# Patient Record
Sex: Female | Born: 2000 | Hispanic: No | Marital: Single | Smoking: Never smoker
Health system: Southern US, Community
[De-identification: ages and names within clinical notes are randomized; demographics above are authoritative.]

## PROBLEM LIST (undated history)

## (undated) DIAGNOSIS — D563 Thalassemia minor: Secondary | ICD-10-CM

## (undated) DIAGNOSIS — E282 Polycystic ovarian syndrome: Secondary | ICD-10-CM

## (undated) DIAGNOSIS — D649 Anemia, unspecified: Secondary | ICD-10-CM

## (undated) DIAGNOSIS — E559 Vitamin D deficiency, unspecified: Secondary | ICD-10-CM

## (undated) HISTORY — DX: Vitamin D deficiency, unspecified: E55.9

## (undated) HISTORY — DX: Thalassemia minor: D56.3

## (undated) HISTORY — DX: Anemia, unspecified: D64.9

## (undated) HISTORY — DX: Polycystic ovarian syndrome: E28.2

---
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
## 2023-05-30 ENCOUNTER — Encounter: Payer: Self-pay | Admitting: Family Medicine

## 2023-05-30 ENCOUNTER — Ambulatory Visit: Payer: BC Managed Care – PPO | Admitting: Family Medicine

## 2023-05-30 ENCOUNTER — Ambulatory Visit (INDEPENDENT_AMBULATORY_CARE_PROVIDER_SITE_OTHER): Payer: BC Managed Care – PPO | Admitting: Family Medicine

## 2023-05-30 VITALS — BP 98/60 | HR 80 | Temp 98.6°F | Ht 69.5 in | Wt 151.2 lb

## 2023-05-30 DIAGNOSIS — R5383 Other fatigue: Secondary | ICD-10-CM

## 2023-05-30 DIAGNOSIS — L659 Nonscarring hair loss, unspecified: Secondary | ICD-10-CM | POA: Diagnosis not present

## 2023-05-30 DIAGNOSIS — E559 Vitamin D deficiency, unspecified: Secondary | ICD-10-CM | POA: Diagnosis not present

## 2023-05-30 DIAGNOSIS — D563 Thalassemia minor: Secondary | ICD-10-CM | POA: Diagnosis not present

## 2023-05-30 DIAGNOSIS — R252 Cramp and spasm: Secondary | ICD-10-CM

## 2023-05-30 NOTE — Assessment & Plan Note (Signed)
Diffuse, likely secondary to weight loss and stress from exams. Checking TSH, D and B12, she already has appt with GYN to rule out PCOS.Marland KitchenMarland Kitchen

## 2023-05-30 NOTE — Progress Notes (Signed)
New Patient Office Visit  Subjective    Patient ID: Tammy Potter, female    DOB: 2001/01/11  Age: 22 y.o. MRN: [id number]  CC:  Chief Complaint  Patient presents with   Establish Care    HPI Tammy Potter presents to establish care Patient is reporting an increase in fatigue and hair loss. Was diagnosed with PCOS some time ago, taking supplements currently. States that the hair loss maybe got a little better, but then had her exams and it got worse, states that she lost weight, about 5 kg also too. Was not placed on any medications for the PCOS, states she will be getting a second opinion, states that her presentation was unusual, was having regular periods, but also having ovulation bleeds, already has an appt with GYN on the 22nd.   States that she used to be overweight, used to weigh 90Kg, states that she intentionally lost weight and reduced it down to 68.6  Also having muscle cramps with the fatigue, also eye twitches but this seems to be better.   Reports family history of goiter in her father, states she did have labs done but they looked normal.   Current Outpatient Medications  Medication Instructions   BIOTIN PO 10 mg, Oral, Daily   OVER THE COUNTER MEDICATION Calcium citrate 400mg   +vit D3  12.5mg -daily   OVER THE COUNTER MEDICATION Grapexon-green tea/grape seed extract/zinc-2 capsules daily   VITAMIN D PO 50,000 Units, Oral, Every 30 days    Past Medical History:  Diagnosis Date   Anemia    PCOS (polycystic ovarian syndrome)    Thalassemia minor    Vitamin D deficiency     History reviewed. No pertinent surgical history.  Family History  Problem Relation Age of Onset   Hyperlipidemia Mother    Hypertension Mother    Heart disease Mother    Hypertension Father    Hyperlipidemia Father    Hearing loss Father    Miscarriages / Stillbirths Maternal Grandmother    Hyperlipidemia Maternal Grandmother    Heart disease Maternal Grandmother    Hearing loss  Maternal Grandmother    Diabetes Maternal Grandmother    COPD Maternal Grandmother    Heart attack Maternal Grandmother    Hyperlipidemia Maternal Grandfather    Heart disease Maternal Grandfather    Hearing loss Maternal Grandfather    Diabetes Maternal Grandfather    Arthritis Maternal Grandfather    CVA Maternal Grandfather    Lupus Paternal Grandmother    Arthritis Paternal Grandfather     Social History   Socioeconomic History   Marital status: Single    Spouse name: Not on file   Number of children: Not on file   Years of education: Not on file   Highest education level: Not on file  Occupational History   Not on file  Tobacco Use   Smoking status: Never   Smokeless tobacco: Never  Vaping Use   Vaping status: Never Used  Substance and Sexual Activity   Alcohol use: Never   Drug use: Never   Sexual activity: Never  Other Topics Concern   Not on file  Social History Narrative   Not on file   Social Determinants of Health   Financial Resource Strain: Not on file  Food Insecurity: Not on file  Transportation Needs: Not on file  Physical Activity: Not on file  Stress: Not on file  Social Connections: Not on file  Intimate Partner Violence: Not on file  Review of Systems  Constitutional:  Positive for malaise/fatigue. Negative for chills and fever.  Musculoskeletal:  Positive for myalgias.  Neurological:  Negative for dizziness and headaches.  Endo/Heme/Allergies: Negative.        Cold intolerance  All other systems reviewed and are negative.       Objective    BP 98/60 (BP Location: Left Arm, Patient Position: Sitting, Cuff Size: Normal)   Pulse 80   Temp 98.6 F (37 C) (Oral)   Ht 5' 9.5" (1.765 m)   Wt 151 lb 3.2 oz (68.6 kg)   LMP 05/19/2023 (Exact Date)   SpO2 99%   BMI 22.01 kg/m   Physical Exam Vitals reviewed.  Constitutional:      Appearance: Normal appearance. She is well-groomed and normal weight.  Eyes:      Conjunctiva/sclera: Conjunctivae normal.  Neck:     Thyroid: No thyromegaly.  Cardiovascular:     Rate and Rhythm: Normal rate and regular rhythm.     Pulses: Normal pulses.     Heart sounds: S1 normal and S2 normal.  Pulmonary:     Effort: Pulmonary effort is normal.     Breath sounds: Normal breath sounds and air entry.  Abdominal:     General: Bowel sounds are normal.  Musculoskeletal:     Right lower leg: No edema.     Left lower leg: No edema.  Neurological:     Mental Status: She is alert and oriented to person, place, and time. Mental status is at baseline.     Gait: Gait is intact.  Psychiatric:        Mood and Affect: Mood and affect normal.        Speech: Speech normal.        Behavior: Behavior normal.        Judgment: Judgment normal.         Assessment & Plan:  Thalassemia minor Assessment & Plan: Checking iron panel today to follow up on this, pt was taking PO iron but hasn't been on it in some time. 11 is usually her baseline hemoglobin.   Hair loss Assessment & Plan: Diffuse, likely secondary to weight loss and stress from exams. Checking TSH, D and B12, she already has appt with GYN to rule out PCOS...  Orders: -     TSH -     Iron, TIBC and Ferritin Panel  Muscle cramps -     VITAMIN D 25 Hydroxy (Vit-D Deficiency, Fractures) -     Comprehensive metabolic panel -     Magnesium  Other fatigue -     Vitamin B12 -     Iron, TIBC and Ferritin Panel    Return in about 1 year (around 05/29/2024).   Karie Georges, MD

## 2023-05-30 NOTE — Assessment & Plan Note (Signed)
Checking iron panel today to follow up on this, pt was taking PO iron but hasn't been on it in some time. 11 is usually her baseline hemoglobin.

## 2023-05-31 LAB — COMPREHENSIVE METABOLIC PANEL
AG Ratio: 2 (calc) (ref 1.0–2.5)
ALT: 13 U/L (ref 6–29)
AST: 15 U/L (ref 10–30)
Albumin: 4.9 g/dL (ref 3.6–5.1)
Alkaline phosphatase (APISO): 31 U/L (ref 31–125)
BUN: 15 mg/dL (ref 7–25)
CO2: 25 mmol/L (ref 20–32)
Calcium: 10.7 mg/dL — ABNORMAL HIGH (ref 8.6–10.2)
Chloride: 104 mmol/L (ref 98–110)
Creat: 0.77 mg/dL (ref 0.50–0.96)
Globulin: 2.5 g/dL (calc) (ref 1.9–3.7)
Glucose, Bld: 78 mg/dL (ref 65–99)
Potassium: 4.3 mmol/L (ref 3.5–5.3)
Sodium: 140 mmol/L (ref 135–146)
Total Bilirubin: 0.7 mg/dL (ref 0.2–1.2)
Total Protein: 7.4 g/dL (ref 6.1–8.1)

## 2023-05-31 LAB — VITAMIN D 25 HYDROXY (VIT D DEFICIENCY, FRACTURES): Vit D, 25-Hydroxy: 27 ng/mL — ABNORMAL LOW (ref 30–100)

## 2023-05-31 LAB — IRON,TIBC AND FERRITIN PANEL
%SAT: 17 % (calc) (ref 16–45)
Ferritin: 12 ng/mL — ABNORMAL LOW (ref 16–154)
Iron: 85 ug/dL (ref 40–190)
TIBC: 503 mcg/dL (calc) — ABNORMAL HIGH (ref 250–450)

## 2023-05-31 LAB — MAGNESIUM: Magnesium: 2.2 mg/dL (ref 1.5–2.5)

## 2023-05-31 LAB — VITAMIN B12: Vitamin B-12: 410 pg/mL (ref 200–1100)

## 2023-05-31 LAB — TSH: TSH: 1.52 mIU/L

## 2023-06-02 DIAGNOSIS — N9089 Other specified noninflammatory disorders of vulva and perineum: Secondary | ICD-10-CM | POA: Diagnosis not present

## 2023-06-02 DIAGNOSIS — E282 Polycystic ovarian syndrome: Secondary | ICD-10-CM | POA: Diagnosis not present

## 2023-06-04 ENCOUNTER — Encounter: Payer: Self-pay | Admitting: Family Medicine

## 2023-06-10 ENCOUNTER — Telehealth: Payer: Self-pay | Admitting: Family Medicine

## 2023-06-10 DIAGNOSIS — G514 Facial myokymia: Secondary | ICD-10-CM | POA: Diagnosis not present

## 2023-06-10 DIAGNOSIS — H53143 Visual discomfort, bilateral: Secondary | ICD-10-CM | POA: Diagnosis not present

## 2023-06-10 DIAGNOSIS — H52533 Spasm of accommodation, bilateral: Secondary | ICD-10-CM | POA: Diagnosis not present

## 2023-06-10 NOTE — Telephone Encounter (Signed)
Katie with [facility] is requesting the most recent  Office visit notes   Please Fax To: [phone]

## 2023-06-10 NOTE — Telephone Encounter (Signed)
Spoke with Alcario Drought at [facility] [phone]) and asked to have her inform Florentina Addison our office cannot send records without a signed release from the patient.

## 2023-07-03 ENCOUNTER — Ambulatory Visit: Payer: Self-pay | Admitting: Family Medicine

## 2024-05-06 ENCOUNTER — Encounter: Payer: Self-pay | Admitting: Family Medicine

## 2024-05-06 ENCOUNTER — Ambulatory Visit (INDEPENDENT_AMBULATORY_CARE_PROVIDER_SITE_OTHER): Admitting: Family Medicine

## 2024-05-06 VITALS — BP 120/88 | HR 84 | Temp 97.9°F | Ht 69.0 in | Wt 151.9 lb

## 2024-05-06 DIAGNOSIS — Z0000 Encounter for general adult medical examination without abnormal findings: Secondary | ICD-10-CM

## 2024-05-06 DIAGNOSIS — D563 Thalassemia minor: Secondary | ICD-10-CM

## 2024-05-06 DIAGNOSIS — E611 Iron deficiency: Secondary | ICD-10-CM | POA: Diagnosis not present

## 2024-05-06 DIAGNOSIS — E559 Vitamin D deficiency, unspecified: Secondary | ICD-10-CM

## 2024-05-06 NOTE — Progress Notes (Signed)
 Complete physical exam  Patient: Tammy Potter   DOB: 2001-03-02   22 y.o. Female  MRN: [id number]  Subjective:    Chief Complaint  Patient presents with   Annual Exam    Tammy Potter is a 23 y.o. female who presents today for a complete physical exam. She reports consuming a general diet. Home exercise routine includes walking 1-2 hrs per week. Gym/ health club routine includes cardio and light weights three times per week. She generally feels well. She reports sleeping fairly well. She does not have additional problems to discuss today.   Pt was taking iron and vitamin D  but hasn't been on them for some time, occasionally takes melatonin at night.  Most recent fall risk assessment:     No data to display           Most recent depression screenings:    05/30/2023   10:30 AM  PHQ 2/9 Scores  PHQ - 2 Score 0  PHQ- 9 Score 2    Vision:Within last year and Dental: No current dental problems and Receives regular dental care  Patient Active Problem List   Diagnosis Date Noted   Hair loss 05/30/2023   Other fatigue 05/30/2023   Thalassemia minor       Patient Care Team: Ozell Heron HERO, MD as PCP - General (Family Medicine)   Outpatient Medications Prior to Visit  Medication Sig   MELATONIN PO Take by mouth daily.   BIOTIN PO Take 10 mg by mouth daily.   OVER THE COUNTER MEDICATION Calcium citrate 400mg   +vit D3  12.5mg -daily   OVER THE COUNTER MEDICATION Grapexon-green tea/grape seed extract/zinc-2 capsules daily   VITAMIN D  PO Take 50,000 Units by mouth every 30 (thirty) days.   No facility-administered medications prior to visit.    Review of Systems  HENT:  Negative for hearing loss.   Eyes:  Negative for blurred vision.  Respiratory:  Negative for shortness of breath.   Cardiovascular:  Negative for chest pain.  Gastrointestinal: Negative.   Genitourinary: Negative.   Musculoskeletal:  Negative for back pain.  Neurological:  Negative for headaches.   Psychiatric/Behavioral:  Negative for depression.        Objective:     BP 120/88   Pulse 84   Temp 97.9 F (36.6 C) (Oral)   Ht 5' 9 (1.753 m)   Wt 151 lb 14.4 oz (68.9 kg)   LMP 05/05/2024   SpO2 98%   BMI 22.43 kg/m    Physical Exam Vitals reviewed.  Constitutional:      Appearance: Normal appearance. She is well-groomed and normal weight.  HENT:     Right Ear: Tympanic membrane and ear canal normal.     Left Ear: Tympanic membrane and ear canal normal.     Mouth/Throat:     Mouth: Mucous membranes are moist.     Pharynx: No posterior oropharyngeal erythema.   Eyes:     Conjunctiva/sclera: Conjunctivae normal.   Neck:     Thyroid : No thyromegaly.   Cardiovascular:     Rate and Rhythm: Normal rate and regular rhythm.     Pulses: Normal pulses.     Heart sounds: S1 normal and S2 normal.  Pulmonary:     Effort: Pulmonary effort is normal.     Breath sounds: Normal breath sounds and air entry.  Abdominal:     General: Abdomen is flat. Bowel sounds are normal.     Palpations: Abdomen is soft.  Musculoskeletal:     Right lower leg: No edema.     Left lower leg: No edema.  Lymphadenopathy:     Cervical: No cervical adenopathy.   Neurological:     Mental Status: She is alert and oriented to person, place, and time. Mental status is at baseline.     Gait: Gait is intact.   Psychiatric:        Mood and Affect: Mood and affect normal.        Speech: Speech normal.        Behavior: Behavior normal.        Judgment: Judgment normal.      No results found for any visits on 05/06/24.     Assessment & Plan:    Routine Health Maintenance and Physical Exam  Immunization History  Administered Date(s) Administered   Tdap 11/05/2018    Health Maintenance  Topic Date Due   HPV VACCINES (1 - 3-dose series) Never done   Meningococcal B Vaccine (1 of 2 - Standard) Never done   Hepatitis B Vaccines (1 of 3 - 19+ 3-dose series) Never done   COVID-19  Vaccine (1 - 2024-25 season) Never done   Cervical Cancer Screening (Pap smear)  Never done   Hepatitis C Screening  05/29/2024 (Originally 09/21/2020)   HIV Screening  05/29/2024 (Originally 09/21/2017)   INFLUENZA VACCINE  06/11/2024   DTaP/Tdap/Td (2 - Td or Tdap) 11/05/2028    Discussed health benefits of physical activity, and encouraged her to engage in regular exercise appropriate for her age and condition.  Routine general medical examination at a health care facility -     Comprehensive metabolic panel with GFR; Future  Vitamin D  deficiency -     VITAMIN D  25 Hydroxy (Vit-D Deficiency, Fractures); Future  Thalassemia minor -     CBC with Differential/Platelet; Future  Iron deficiency -     Iron, TIBC and Ferritin Panel; Future  Normal physical exam findings. I counseled the patient on the recommended amount of exercise per CDC recommendation as well as healthy sleep habits. I reviewed preventative screening, immunizations, and medical history and updated in the chart, and appropriate labs and vaccinations were ordered. Handouts given on healthy eating and exercise.    Return in 1 year (on 05/06/2025) for annual physical exam.     Heron CHRISTELLA Sharper, MD

## 2024-05-06 NOTE — Patient Instructions (Signed)

## 2024-05-07 ENCOUNTER — Ambulatory Visit: Payer: Self-pay | Admitting: Family Medicine

## 2024-05-07 LAB — IRON,TIBC AND FERRITIN PANEL
%SAT: 29 % (ref 16–45)
Ferritin: 42 ng/mL (ref 16–154)
Iron: 111 ug/dL (ref 40–190)
TIBC: 383 ug/dL (ref 250–450)

## 2024-05-07 LAB — COMPREHENSIVE METABOLIC PANEL WITH GFR
ALT: 19 U/L (ref 0–35)
AST: 17 U/L (ref 0–37)
Albumin: 4.7 g/dL (ref 3.5–5.2)
Alkaline Phosphatase: 27 U/L — ABNORMAL LOW (ref 39–117)
BUN: 19 mg/dL (ref 6–23)
CO2: 29 meq/L (ref 19–32)
Calcium: 9.7 mg/dL (ref 8.4–10.5)
Chloride: 103 meq/L (ref 96–112)
Creatinine, Ser: 1.09 mg/dL (ref 0.40–1.20)
GFR: 72.56 mL/min (ref >60.00)
Glucose, Bld: 77 mg/dL (ref 70–99)
Potassium: 4.5 meq/L (ref 3.5–5.1)
Sodium: 138 meq/L (ref 135–145)
Total Bilirubin: 0.6 mg/dL (ref 0.2–1.2)
Total Protein: 7.3 g/dL (ref 6.0–8.3)

## 2024-05-07 LAB — CBC WITH DIFFERENTIAL/PLATELET
Basophils Absolute: 0 10*3/uL (ref 0.0–0.1)
Basophils Relative: 0.3 % (ref 0.0–3.0)
Eosinophils Absolute: 0.1 10*3/uL (ref 0.0–0.7)
Eosinophils Relative: 0.9 % (ref 0.0–5.0)
HCT: 36.9 % (ref 36.0–46.0)
Hemoglobin: 11.6 g/dL — ABNORMAL LOW (ref 12.0–15.0)
Lymphocytes Relative: 20.5 % (ref 12.0–46.0)
Lymphs Abs: 1.6 10*3/uL (ref 0.7–4.0)
MCHC: 31.4 g/dL (ref 30.0–36.0)
MCV: 61.8 fl — ABNORMAL LOW (ref 78.0–100.0)
Monocytes Absolute: 0.5 10*3/uL (ref 0.1–1.0)
Monocytes Relative: 6.4 % (ref 3.0–12.0)
Neutro Abs: 5.7 10*3/uL (ref 1.4–7.7)
Neutrophils Relative %: 71.9 % (ref 43.0–77.0)
Platelets: 325 10*3/uL (ref 150.0–400.0)
RBC: 5.98 Mil/uL — ABNORMAL HIGH (ref 3.87–5.11)
RDW: 14.8 % (ref 11.5–15.5)
WBC: 7.9 10*3/uL (ref 4.0–10.5)

## 2024-05-07 LAB — VITAMIN D 25 HYDROXY (VIT D DEFICIENCY, FRACTURES): VITD: 23.61 ng/mL — ABNORMAL LOW (ref 30.00–100.00)

## 2024-05-12 DIAGNOSIS — Z124 Encounter for screening for malignant neoplasm of cervix: Secondary | ICD-10-CM | POA: Diagnosis not present

## 2024-05-12 DIAGNOSIS — Z6822 Body mass index (BMI) 22.0-22.9, adult: Secondary | ICD-10-CM | POA: Diagnosis not present

## 2024-05-12 DIAGNOSIS — Z01419 Encounter for gynecological examination (general) (routine) without abnormal findings: Secondary | ICD-10-CM | POA: Diagnosis not present

## 2024-06-25 DIAGNOSIS — H53143 Visual discomfort, bilateral: Secondary | ICD-10-CM | POA: Diagnosis not present

## 2024-06-25 DIAGNOSIS — G514 Facial myokymia: Secondary | ICD-10-CM | POA: Diagnosis not present
